# Patient Record
Sex: Male | Born: 1945 | Race: White | Hispanic: No | Marital: Married | Smoking: Never smoker
Health system: Southern US, Community
[De-identification: ages and names within clinical notes are randomized; demographics above are authoritative.]

## PROBLEM LIST (undated history)

## (undated) DIAGNOSIS — I4891 Unspecified atrial fibrillation: Secondary | ICD-10-CM

## (undated) DIAGNOSIS — E079 Disorder of thyroid, unspecified: Secondary | ICD-10-CM

## (undated) DIAGNOSIS — G4733 Obstructive sleep apnea (adult) (pediatric): Secondary | ICD-10-CM

## (undated) DIAGNOSIS — C61 Malignant neoplasm of prostate: Secondary | ICD-10-CM

## (undated) DIAGNOSIS — M1990 Unspecified osteoarthritis, unspecified site: Secondary | ICD-10-CM

## (undated) HISTORY — PX: TRANSURETHRAL RESECTION OF PROSTATE: SHX73

## (undated) HISTORY — PX: ESOPHAGEAL DILATION: SHX303

## (undated) HISTORY — PX: TONSILLECTOMY: SUR1361

---

## 2015-02-18 ENCOUNTER — Encounter (HOSPITAL_COMMUNITY): Payer: Self-pay | Admitting: Vascular Surgery

## 2015-02-18 ENCOUNTER — Emergency Department (HOSPITAL_COMMUNITY)
Admission: EM | Admit: 2015-02-18 | Discharge: 2015-02-18 | Disposition: A | Discharge status: Home / Self Care | Payer: Medicare HMO | Attending: Emergency Medicine | Admitting: Emergency Medicine

## 2015-02-18 ENCOUNTER — Emergency Department (HOSPITAL_COMMUNITY): Payer: Medicare HMO

## 2015-02-18 DIAGNOSIS — Y92410 Unspecified street and highway as the place of occurrence of the external cause: Secondary | ICD-10-CM | POA: Diagnosis not present

## 2015-02-18 DIAGNOSIS — S29009A Unspecified injury of muscle and tendon of unspecified wall of thorax, initial encounter: Secondary | ICD-10-CM | POA: Diagnosis not present

## 2015-02-18 DIAGNOSIS — Z8546 Personal history of malignant neoplasm of prostate: Secondary | ICD-10-CM | POA: Diagnosis not present

## 2015-02-18 DIAGNOSIS — Z8669 Personal history of other diseases of the nervous system and sense organs: Secondary | ICD-10-CM | POA: Diagnosis not present

## 2015-02-18 DIAGNOSIS — Z8739 Personal history of other diseases of the musculoskeletal system and connective tissue: Secondary | ICD-10-CM | POA: Diagnosis not present

## 2015-02-18 DIAGNOSIS — S6992XA Unspecified injury of left wrist, hand and finger(s), initial encounter: Secondary | ICD-10-CM | POA: Insufficient documentation

## 2015-02-18 DIAGNOSIS — Y9389 Activity, other specified: Secondary | ICD-10-CM | POA: Diagnosis not present

## 2015-02-18 DIAGNOSIS — S79912A Unspecified injury of left hip, initial encounter: Secondary | ICD-10-CM | POA: Diagnosis present

## 2015-02-18 DIAGNOSIS — Z8639 Personal history of other endocrine, nutritional and metabolic disease: Secondary | ICD-10-CM | POA: Diagnosis not present

## 2015-02-18 DIAGNOSIS — Y998 Other external cause status: Secondary | ICD-10-CM | POA: Insufficient documentation

## 2015-02-18 DIAGNOSIS — Z8679 Personal history of other diseases of the circulatory system: Secondary | ICD-10-CM | POA: Diagnosis not present

## 2015-02-18 DIAGNOSIS — S8992XA Unspecified injury of left lower leg, initial encounter: Secondary | ICD-10-CM | POA: Insufficient documentation

## 2015-02-18 HISTORY — DX: Unspecified atrial fibrillation: I48.91

## 2015-02-18 HISTORY — DX: Obstructive sleep apnea (adult) (pediatric): G47.33

## 2015-02-18 HISTORY — DX: Disorder of thyroid, unspecified: E07.9

## 2015-02-18 HISTORY — DX: Unspecified osteoarthritis, unspecified site: M19.90

## 2015-02-18 HISTORY — DX: Malignant neoplasm of prostate: C61
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
